# Patient Record
Sex: Female | Born: 2000 | Race: White | Hispanic: No | Marital: Single | Smoking: Never smoker
Health system: Southern US, Community
[De-identification: ages and names within clinical notes are randomized; demographics above are authoritative.]

## PROBLEM LIST (undated history)

## (undated) DIAGNOSIS — G43909 Migraine, unspecified, not intractable, without status migrainosus: Secondary | ICD-10-CM

## (undated) HISTORY — DX: Migraine, unspecified, not intractable, without status migrainosus: G43.909

---
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
## 2018-01-05 MED FILL — VYVANSE 30 MG CAPSULE: 30 | 30 days supply | Qty: 30 | Fill #0

## 2018-01-20 ENCOUNTER — Other Ambulatory Visit: Payer: Self-pay | Admitting: Pediatrics

## 2018-01-25 ENCOUNTER — Ambulatory Visit: Payer: 59 | Admitting: Neurology

## 2018-03-01 ENCOUNTER — Encounter: Payer: Self-pay | Admitting: Neurology

## 2018-03-08 ENCOUNTER — Encounter: Payer: Self-pay | Admitting: Neurology

## 2018-03-18 DIAGNOSIS — Z7182 Exercise counseling: Secondary | ICD-10-CM | POA: Diagnosis not present

## 2018-03-18 DIAGNOSIS — F901 Attention-deficit hyperactivity disorder, predominantly hyperactive type: Secondary | ICD-10-CM | POA: Diagnosis not present

## 2018-03-18 DIAGNOSIS — Z713 Dietary counseling and surveillance: Secondary | ICD-10-CM | POA: Diagnosis not present

## 2018-03-22 MED FILL — VYVANSE 30 MG CAPSULE: 30 | 30 days supply | Qty: 30 | Fill #0

## 2018-03-22 MED FILL — DEXTROAMP-AMPHETAMINE 5 MG: 5 | 30 days supply | Qty: 60 | Fill #0

## 2018-04-21 ENCOUNTER — Other Ambulatory Visit: Payer: Self-pay | Admitting: Family

## 2018-04-21 ENCOUNTER — Other Ambulatory Visit: Payer: Self-pay | Admitting: Pediatrics

## 2018-04-21 DIAGNOSIS — N926 Irregular menstruation, unspecified: Secondary | ICD-10-CM

## 2018-04-21 MED ORDER — AMPHETAMINE-DEXTROAMPHETAMINE 5 MG PO TABS: 5.0000 mg | ORAL_TABLET | Freq: Every day | ORAL | 0 refills | Status: AC

## 2018-04-21 MED ORDER — VYVANSE 30 MG PO CAPS: 30.0000 mg | ORAL_CAPSULE | Freq: Every day | ORAL | 0 refills | Status: AC

## 2018-04-21 MED FILL — AMPHETAMINE-DEXTROAMPHETAMI: 5 | 30 days supply | Qty: 30 | Fill #0

## 2018-04-21 MED FILL — LARIN FE 1.5-30 TABLET: 1.5-30 | 84 days supply | Qty: 112 | Fill #0

## 2018-04-21 MED FILL — VYVANSE 30 MG CAPSULE: 30 | 30 days supply | Qty: 30 | Fill #0

## 2018-05-19 MED FILL — VYVANSE 30 MG CAPSULE: 30 | 30 days supply | Qty: 30 | Fill #0

## 2018-05-19 MED FILL — DEXTROAMP-AMPHETAMINE 5 MG: 5 | 30 days supply | Qty: 30 | Fill #0

## 2018-07-01 ENCOUNTER — Other Ambulatory Visit: Payer: Self-pay | Admitting: Pediatrics

## 2018-07-01 DIAGNOSIS — B9689 Other specified bacterial agents as the cause of diseases classified elsewhere: Secondary | ICD-10-CM

## 2018-07-01 DIAGNOSIS — N760 Acute vaginitis: Principal | ICD-10-CM

## 2018-07-01 MED FILL — VYVANSE 30 MG CAPSULE: 30 | 30 days supply | Qty: 30 | Fill #0

## 2018-07-01 MED FILL — metroNIDAZOLE 0.75 % GEL: 0.75 | 90 days supply | Qty: 70 | Fill #0

## 2018-07-01 MED FILL — DEXTROAMP-AMPHETAMINE 5 MG: 5 | 30 days supply | Qty: 30 | Fill #0

## 2018-07-26 DIAGNOSIS — Z7182 Exercise counseling: Secondary | ICD-10-CM | POA: Diagnosis not present

## 2018-07-26 DIAGNOSIS — F901 Attention-deficit hyperactivity disorder, predominantly hyperactive type: Secondary | ICD-10-CM | POA: Diagnosis not present

## 2018-07-26 DIAGNOSIS — Z713 Dietary counseling and surveillance: Secondary | ICD-10-CM | POA: Diagnosis not present

## 2018-07-27 DIAGNOSIS — Z0120 Encounter for dental examination and cleaning without abnormal findings: Secondary | ICD-10-CM | POA: Diagnosis not present

## 2018-07-28 MED FILL — CYCLOBENZAPRINE HCL 5 MG TA: 5 | 23 days supply | Qty: 10 | Fill #2

## 2018-07-29 ENCOUNTER — Other Ambulatory Visit: Payer: Self-pay | Admitting: Neurology

## 2018-07-29 DIAGNOSIS — G43409 Hemiplegic migraine, not intractable, without status migrainosus: Secondary | ICD-10-CM

## 2018-07-29 MED FILL — LARIN FE 1.5-30 TABLET: 1.5-30 | 84 days supply | Qty: 112 | Fill #1

## 2018-08-01 ENCOUNTER — Telehealth: Payer: Self-pay | Admitting: Neurology

## 2018-08-01 MED FILL — PREVIDENT 5000 BOOSTER PLUS: 1.1 | 30 days supply | Qty: 100 | Fill #0

## 2018-08-01 MED FILL — VYVANSE 30 MG CAPSULE: 30 | 30 days supply | Qty: 30 | Fill #0

## 2018-08-01 MED FILL — DEXTROAMP-AMPHETAMINE 5 MG: 5 | 30 days supply | Qty: 30 | Fill #0

## 2018-08-01 NOTE — Telephone Encounter (Signed)
Rx request was denied due to pt not being seen since 07/27/18 with no return appointment scheduled.  Will send new Rx once appointment is made. (this information was forwarded to the pharmacy last week when request was originally sent)

## 2018-08-01 NOTE — Telephone Encounter (Signed)
Patient's mother called and Maureen RalphsVivian is needing a refill on her Flexeril (10). She uses [facility]. She said a refill request has been faxed over. Thanks

## 2018-09-23 MED FILL — VYVANSE 30 MG CAPSULE: 30 | 30 days supply | Qty: 30 | Fill #0

## 2018-11-30 IMAGING — MR MR HEAD WO/W CM
9 of 10 series · 37 of 48 positions shown · IV contrast (9ML MULTIHANCE)
Comparison: None.

CLINICAL DATA: Chronic headaches, migraines for 2 years.

EXAM:
MRI HEAD WITHOUT AND WITH CONTRAST
TECHNIQUE: Multiplanar, multiecho pulse sequences of the brain and surrounding
structures were obtained without and with intravenous contrast.
CONTRAST:  9 cc MultiHance

[Series 2: T1 · sagittal · 5.0mm · 0.45mm/px · 2 of 21 slices shown]
[im 1/21]
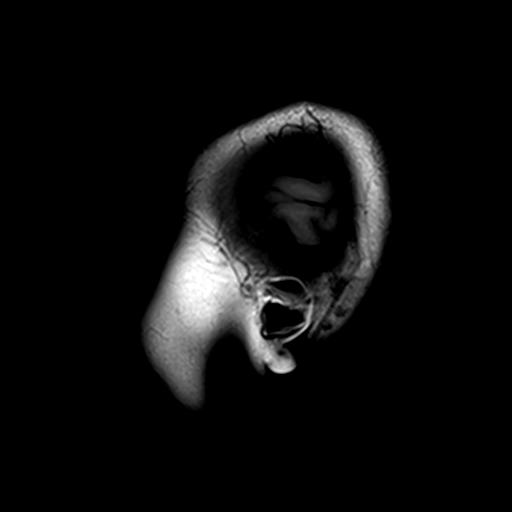
[im 21/21]
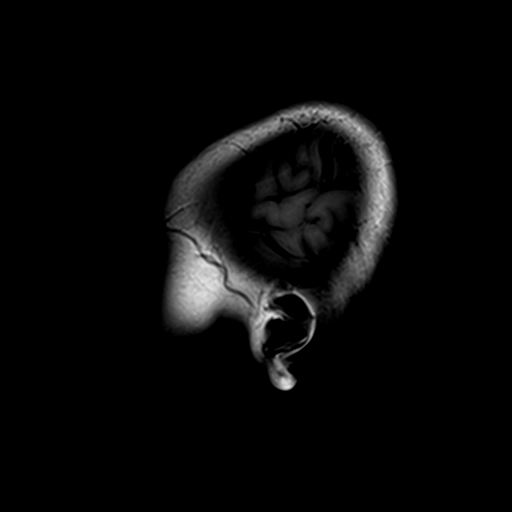

[Series 3: DWI · axial · 3.0mm · 1.80mm/px · z∈[-30,+117]mm · 7 of 100 slices shown (1 of 2)]
[im 1/100]
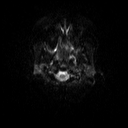
[im 17/100]
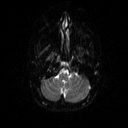
[im 34/100]
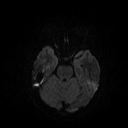
[im 50/100]
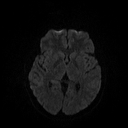
[im 67/100]
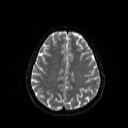
[im 83/100]
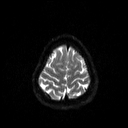
[im 100/100]
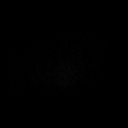

[Series 4: DWI · axial · 3.0mm · 1.80mm/px · z∈[-30,+117]mm · 3 of 48 slices shown (2 of 2)]
[im 1/48]
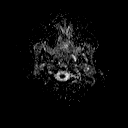
[im 24/48]
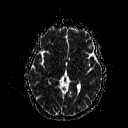
[im 48/48]
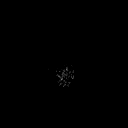

[Series 6: swi_images · axial · 2.0mm · 0.90mm/px · z∈[-44,+113]mm · 6 of 80 slices shown]
[im 1/80]
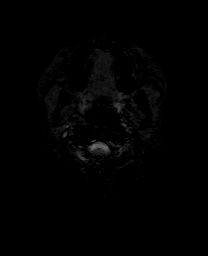
[im 16/80]
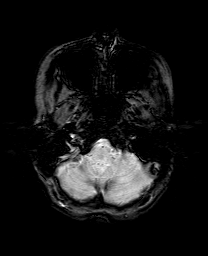
[im 32/80]
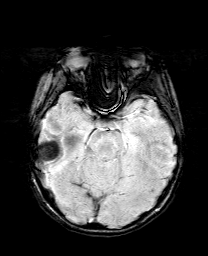
[im 48/80]
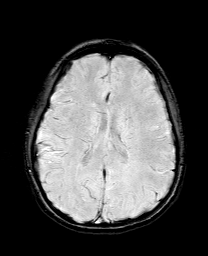
[im 64/80]
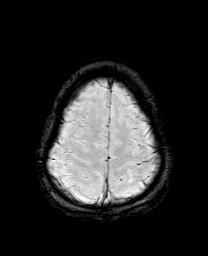
[im 80/80]
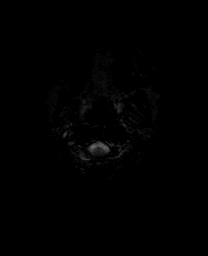

[Series 7: T2 · axial · 5.0mm · 0.51mm/px · z∈[-36,+105]mm · 2 of 22 slices shown (1 of 2)]
[im 1/22]
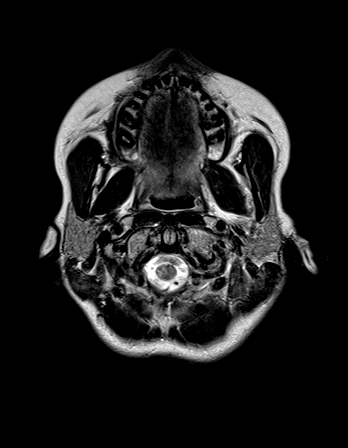
[im 22/22]
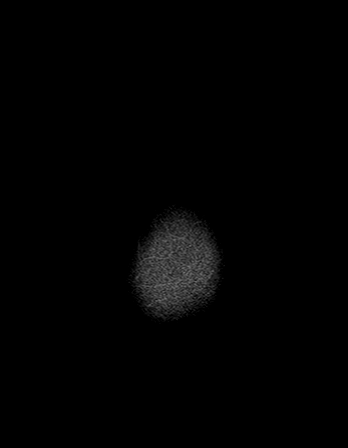

[Series 8: FLAIR · axial · 3.0mm · 0.45mm/px · z∈[-44,+112]mm · 2 of 27 slices shown]
[im 1/27]
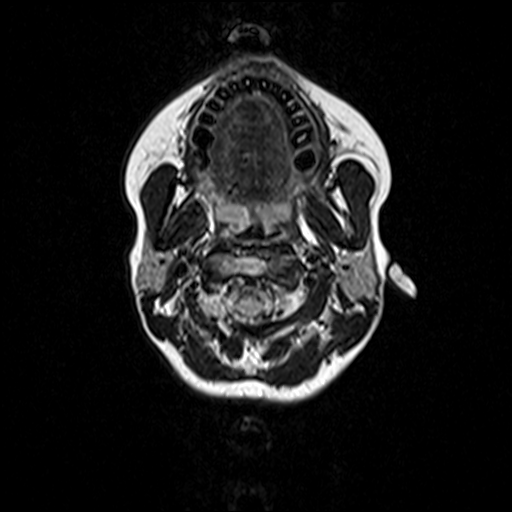
[im 27/27]
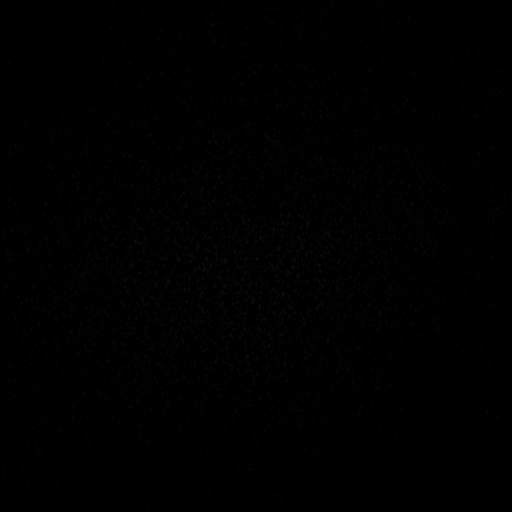

[Series 9: t1_mpr_tra · axial · 1.0mm · 0.45mm/px · z∈[-45,+114]mm · 8 of 160 slices shown (1 of 2)]
[im 1/160]
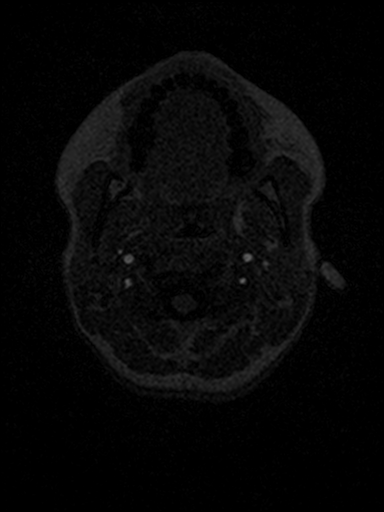
[im 32/160]
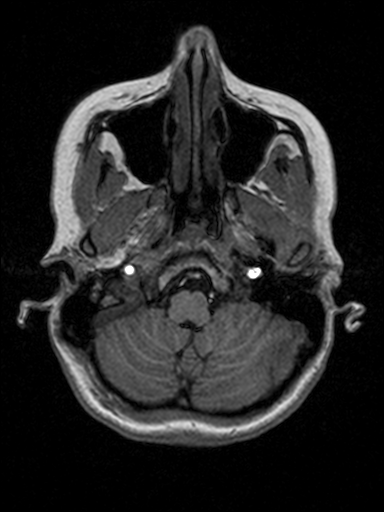
[im 48/160]
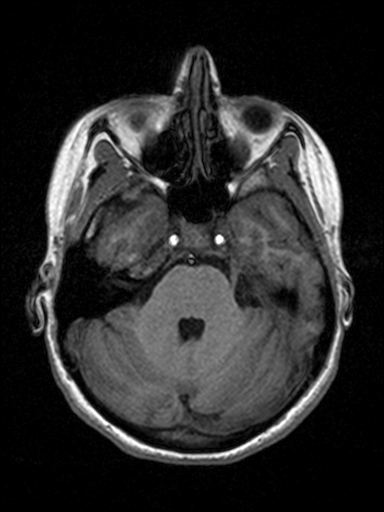
[im 64/160]
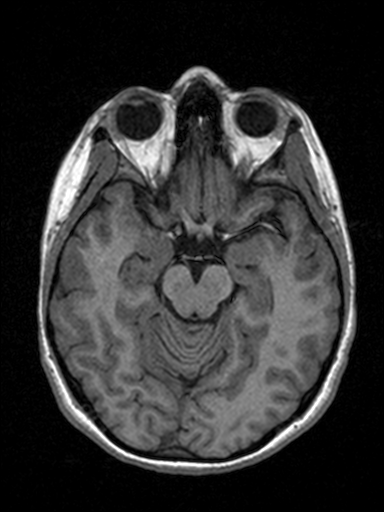
[im 96/160]
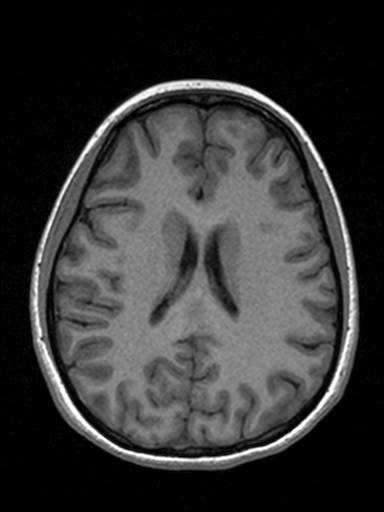
[im 112/160]
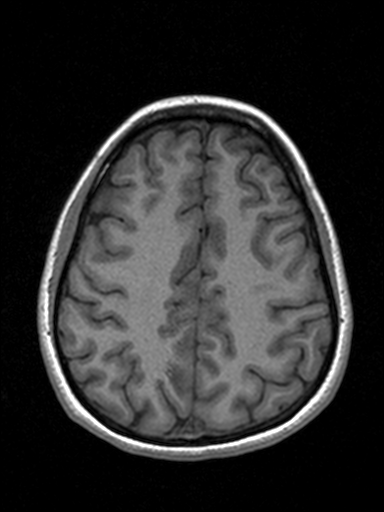
[im 128/160]
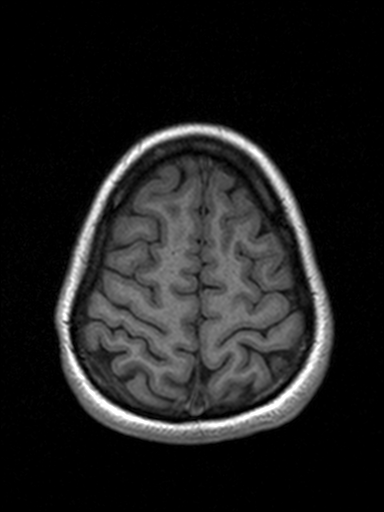
[im 160/160]
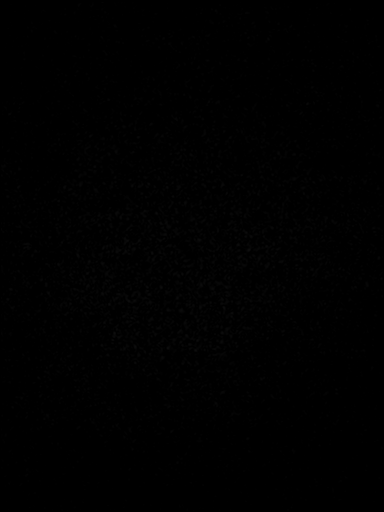

[Series 10: T2 · coronal · 5.0mm · 0.45mm/px · 2 of 26 slices shown (2 of 2)]
[im 1/26]
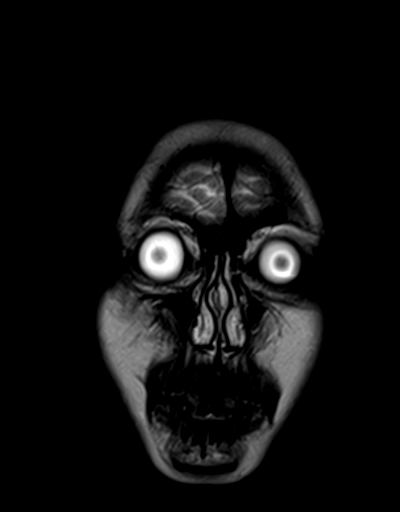
[im 26/26]
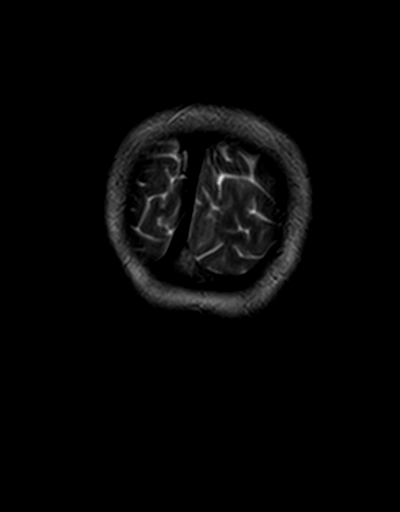

[Series 11: t1_mpr_tra · axial · 1.0mm · 0.45mm/px · z∈[-45,+50]mm · 5 of 160 slices shown (2 of 2)]
[im 1/160]
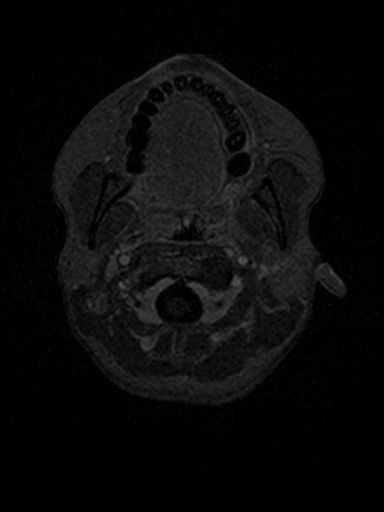
[im 32/160]
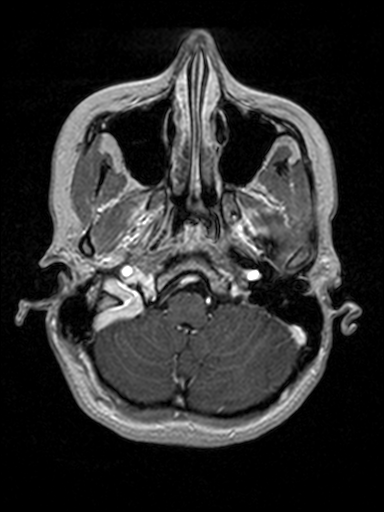
[im 48/160]
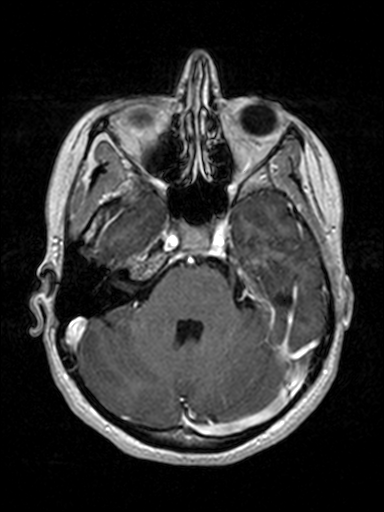
[im 64/160]
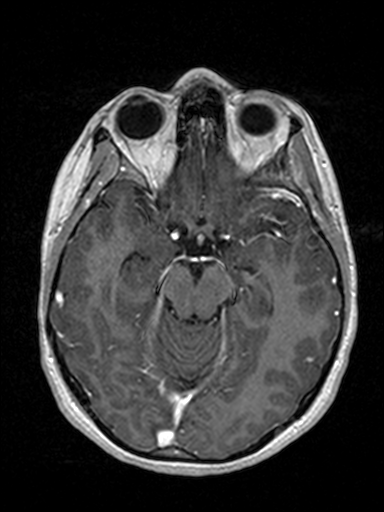
[im 96/160]
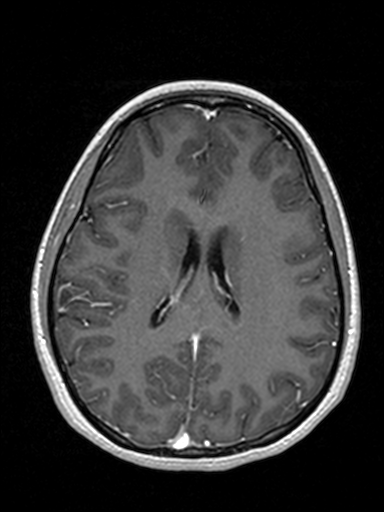

[37 of 48 positions shown; findings below may reference images not displayed]

FINDINGS: INTRACRANIAL CONTENTS: No reduced diffusion to suggest acute
ischemia or hyperacute demyelination. No susceptibility artifact to
suggest hemorrhage. The ventricles and sulci are normal for
patient's age. No suspicious parenchymal signal, masses, mass
effect. No abnormal intraparenchymal or extra-axial enhancement. No
abnormal extra-axial fluid collections. No extra-axial masses.

VASCULAR: Normal major intracranial vascular flow voids present at
skull base.

SKULL AND UPPER CERVICAL SPINE: No abnormal sellar expansion. No
suspicious calvarial bone marrow signal. Craniocervical junction
maintained.

SINUSES/ORBITS: The mastoid air-cells and included paranasal sinuses
are well-aerated.The included ocular globes and orbital contents are
non-suspicious.

OTHER: None.
IMPRESSION: Normal MRI of the head with and without contrast.

## 2018-12-06 ENCOUNTER — Encounter: Payer: Self-pay | Admitting: Neurology

## 2018-12-07 MED FILL — LARIN FE 1.5-30 TABLET: 1.5-30 | 84 days supply | Qty: 112 | Fill #2

## 2018-12-20 ENCOUNTER — Ambulatory Visit: Payer: 59 | Admitting: Neurology

## 2018-12-22 ENCOUNTER — Other Ambulatory Visit: Payer: Self-pay

## 2018-12-22 ENCOUNTER — Telehealth (INDEPENDENT_AMBULATORY_CARE_PROVIDER_SITE_OTHER): Payer: 59 | Admitting: Neurology

## 2018-12-22 DIAGNOSIS — Z713 Dietary counseling and surveillance: Secondary | ICD-10-CM | POA: Diagnosis not present

## 2018-12-22 DIAGNOSIS — F901 Attention-deficit hyperactivity disorder, predominantly hyperactive type: Secondary | ICD-10-CM | POA: Diagnosis not present

## 2018-12-22 DIAGNOSIS — Z0000 Encounter for general adult medical examination without abnormal findings: Secondary | ICD-10-CM | POA: Diagnosis not present

## 2018-12-22 DIAGNOSIS — Z7189 Other specified counseling: Secondary | ICD-10-CM | POA: Diagnosis not present

## 2018-12-22 DIAGNOSIS — Z68.41 Body mass index (BMI) pediatric, 5th percentile to less than 85th percentile for age: Secondary | ICD-10-CM | POA: Diagnosis not present

## 2018-12-22 DIAGNOSIS — G43409 Hemiplegic migraine, not intractable, without status migrainosus: Secondary | ICD-10-CM | POA: Diagnosis not present

## 2018-12-22 MED ORDER — CYCLOBENZAPRINE HCL 5 MG PO TABS: ORAL_TABLET | ORAL | 11 refills | Status: DC

## 2018-12-22 MED FILL — CYCLOBENZAPRINE HCL 5 MG TA: 5 | 28 days supply | Qty: 12 | Fill #0

## 2018-12-22 NOTE — Progress Notes (Signed)
Virtual Visit via Video Note The purpose of this virtual visit is to provide medical care while limiting exposure to the novel coronavirus.    Consent was obtained for video visit:  Yes.   Answered questions that patient had about telehealth interaction:  Yes.   I discussed the limitations, risks, security and privacy concerns of performing an evaluation and management service by telemedicine. I also discussed with the patient that there may be a patient responsible charge related to this service. The patient expressed understanding and agreed to proceed.  Pt location: Home Physician Location: office Name of referring provider:  Maryellen Pile, MD I connected with Willaim Bane at patients initiation/request on 12/22/2018 at 11:30 AM EDT by video enabled telemedicine application and verified that I am speaking with the correct person using two identifiers. Pt MRN:  [id number] Pt DOB:  03-18-2001 Video Participants:  Willaim Bane   History of Present Illness:  The patient was last seen in December 2018 for migraines. She had 2 episodes of complicated migraine in 2017 with left-sided numbness. She denies any further similar focal symptoms, but continues to have migraines around 5 times a month. Migraines usually start in her neck and radiate up her head, with associated sensitivity to lights and sound. No nausea/vomiting. She has prn Flexeril which has really helped her. She reports migraines come in phases, sometimes she gets 2 in a week, then none for a week. She takes Advil as well, usually around twice a week on average. She had contacted our office in 02/2018 to report an improvement in migraines with daily Zyrtec, she continues on Zyrtec. Sleep is good. She denies any dizziness, diplopia. Sometimes she has numbness in her hands and feet. No falls.   HPI 12/08/2016: This is a pleasant 18 yo RH woman with no significant past medical history with migraines with focal symptoms. She started having  headaches 2 years ago, probably around puberty. Headaches are usually behind the left eye, sometimes she would have 3 in a week, then do fine for a couple of weeks. In December/January, she had a different type of symptoms with the headache, she felt that one side of her body was numb. She thinks it was the right side, notes from PCP indicate left-sided numbness. She has had 2 of these episodes, last one was a couple of months ago. She has associated nausea, photophobia, no visual obscurations. One time her vision went black for a minute like her blood pressure was low. She takes 2 Advil which helps somewhat. She has a prescription for Maxalt but has not taken it. Headaches would last for a couple of hours, she would usually go to school late if she woke up with a headache. She has her menstrual period every month with no effect on the headaches. She is on Loestrin, and they wonder about effects of birth control on the migraines. She denies any dizziness, diplopia, dysarthria/dysphagia, neck pain, bowel/bladder dysfunction. She has some back pain with pain going down her back and neck. She has noticed more headaches when the weather is hot. Sleep is not that good, but she has not noticed any association of headaches with her sleep patterns. There is a strong family history of migraines in her mother, sister, maternal grandmother, aunt, and uncle. There was concern about prolactin levels in 2013, she had an MRI brain done at that time reported as unremarkable.     Observations/Objective:   GEN:  The patient appears stated age and  is in NAD. Neurological examination: Patient is awake, alert, oriented x 3. No aphasia or dysarthria. Intact fluency and comprehension. Remote and recent memory intact. Able to name and repeat. Cranial nerves: Extraocular movements intact with no nystagmus. No facial asymmetry. Motor: moves all extremities symmetrically, at least anti-gravity x 4. No incoordination on finger to nose  testing. Gait: narrow-based and steady, able to tandem walk adequately. Negative Romberg test.   Assessment and Plan:   This is a pleasant 18 yo RH woman with no significant past medical history, strong family history of migraines, with headaches are suggestive of migraine without aura, she has had 2 episodes since December/January where headaches would be followed by numbness/?weakness on one side of her body, suggestive of complicated/hemiplegic migraines. No further focal symptoms since then, she reports around 5 migraines a month with good response to Flexeril and Advil. Refills for Flexeril sent. We had previously discussed a daily migraine preventative medication, she is currently comfortable with prn rescue medications and will keep a migraine calendar. If headaches increase, we will consider propranolol or nortriptyline. She was advised to minimize rescue medication to 2-3 a week to avoid rebound headaches. She will follow-up in 6 months and knows to call for any changes.   Follow Up Instructions:   -I discussed the assessment and treatment plan with the patient. The patient was provided an opportunity to ask questions and all were answered. The patient agreed with the plan and demonstrated an understanding of the instructions.   The patient was advised to call back or seek an in-person evaluation if the symptoms worsen or if the condition fails to improve as anticipated.   Van ClinesKaren M Jasmain Ahlberg, MD

## 2018-12-23 MED FILL — AMPHETAMINE-DEXTROAMPHETAMI: 5 | 30 days supply | Qty: 30 | Fill #0

## 2018-12-23 MED FILL — VYVANSE 30 MG CAPSULE: 30 | 30 days supply | Qty: 30 | Fill #0

## 2018-12-26 ENCOUNTER — Ambulatory Visit: Payer: Self-pay | Admitting: Pediatrics

## 2018-12-28 ENCOUNTER — Ambulatory Visit (INDEPENDENT_AMBULATORY_CARE_PROVIDER_SITE_OTHER): Payer: 59 | Admitting: Pediatrics

## 2018-12-28 ENCOUNTER — Other Ambulatory Visit: Payer: Self-pay

## 2018-12-28 DIAGNOSIS — N926 Irregular menstruation, unspecified: Secondary | ICD-10-CM

## 2018-12-28 MED ORDER — NORETHIN ACE-ETH ESTRAD-FE 1.5-30 MG-MCG PO TABS: ORAL_TABLET | ORAL | 3 refills | Status: AC

## 2018-12-28 NOTE — Progress Notes (Signed)
Virtual Visit via Video Note  I connected with Caitlyn Wolf 's patient  on 12/28/18 at  2:00 PM EDT by a video enabled telemedicine application and verified that I am speaking with the correct person using two identifiers.   Location of patient/parent: at home    I discussed the limitations of evaluation and management by telemedicine and the availability of in person appointments.  I discussed that the purpose of this phone visit is to provide medical care while limiting exposure to the novel coronavirus.  The patient expressed understanding and agreed to proceed.  Reason for visit: f/u birth control and BV  History of Present Illness:  Finished school online last week. She did well.  Back for check for birth control pills today. Going well. She is not having any issues so she would like to stay on it.  BV completely went away- didn't finish treatments with metrogel for 6 months. Stopped after 2 months and it went away.  Not sexually active right now. Last time about 1 year ago. Same partner.   Seeing neuro and got represcribed flexeril.   Still taking ADHD medication. Going well with no concerns.    Observations/Objective:  Physical Exam Constitutional:      Appearance: Normal appearance.  Pulmonary:     Effort: Pulmonary effort is normal.  Musculoskeletal: Normal range of motion.  Neurological:     Mental Status: She is alert and oriented to person, place, and time.  Psychiatric:        Mood and Affect: Mood normal.        Behavior: Behavior normal.     Assessment and Plan:  1. Irregular menses Continue ocp. Repeat gc/ct when she is in the office next time. No new partners since last visit. Very pleased with ocp overall. Continuous cycles for convenience on and off.  - norethindrone-ethinyl estradiol-iron (LARIN FE 1.5/30) 1.5-30 MG-MCG tablet; TAKE 1 TABLET BY MOUTH ONCE DAILY (TAKE CONTINUOUSLY)  Dispense: 112 tablet; Refill: 3   Follow Up Instructions: 1 yeat or sooner  as needed    I discussed the assessment and treatment plan with the patient and/or parent/guardian. They were provided an opportunity to ask questions and all were answered. They agreed with the plan and demonstrated an understanding of the instructions.   They were advised to call back or seek an in-person evaluation in the emergency room if the symptoms worsen or if the condition fails to improve as anticipated.  I provided 10 minutes of non-face-to-face time and 0 minutes of care coordination during this encounter I was located at off site during this encounter.  Alfonso Ramus, FNP

## 2019-01-12 DIAGNOSIS — Z0120 Encounter for dental examination and cleaning without abnormal findings: Secondary | ICD-10-CM | POA: Diagnosis not present

## 2019-02-22 DIAGNOSIS — F902 Attention-deficit hyperactivity disorder, combined type: Secondary | ICD-10-CM | POA: Diagnosis not present

## 2019-02-27 MED FILL — VYVANSE 30 MG CAPSULE: 30 | 30 days supply | Qty: 30 | Fill #0

## 2019-02-27 MED FILL — LARIN FE 1.5-30 TABLET: 1.5-30 | 84 days supply | Qty: 112 | Fill #0

## 2019-02-27 MED FILL — DEXTROAMP-AMPHETAMINE 5 MG: 5 | 30 days supply | Qty: 30 | Fill #0

## 2019-03-01 DIAGNOSIS — F902 Attention-deficit hyperactivity disorder, combined type: Secondary | ICD-10-CM | POA: Diagnosis not present

## 2019-03-14 DIAGNOSIS — F902 Attention-deficit hyperactivity disorder, combined type: Secondary | ICD-10-CM | POA: Diagnosis not present

## 2019-03-18 DIAGNOSIS — Z713 Dietary counseling and surveillance: Secondary | ICD-10-CM | POA: Diagnosis not present

## 2019-03-18 DIAGNOSIS — Z7189 Other specified counseling: Secondary | ICD-10-CM | POA: Diagnosis not present

## 2019-03-18 DIAGNOSIS — F901 Attention-deficit hyperactivity disorder, predominantly hyperactive type: Secondary | ICD-10-CM | POA: Diagnosis not present

## 2019-04-29 DIAGNOSIS — Z23 Encounter for immunization: Secondary | ICD-10-CM | POA: Diagnosis not present

## 2019-06-17 DIAGNOSIS — Z713 Dietary counseling and surveillance: Secondary | ICD-10-CM | POA: Diagnosis not present

## 2019-06-17 DIAGNOSIS — F901 Attention-deficit hyperactivity disorder, predominantly hyperactive type: Secondary | ICD-10-CM | POA: Diagnosis not present

## 2019-06-17 DIAGNOSIS — Z7189 Other specified counseling: Secondary | ICD-10-CM | POA: Diagnosis not present

## 2019-06-29 MED FILL — LARIN FE 1.5-30 TABLET: 1.5-30 | 84 days supply | Qty: 112 | Fill #0

## 2019-08-07 ENCOUNTER — Other Ambulatory Visit: Payer: Self-pay

## 2019-08-07 ENCOUNTER — Encounter: Payer: Self-pay | Admitting: Neurology

## 2019-08-07 ENCOUNTER — Telehealth (INDEPENDENT_AMBULATORY_CARE_PROVIDER_SITE_OTHER): Payer: 59 | Admitting: Neurology

## 2019-08-07 VITALS — Ht 64.0 in | Wt 105.0 lb

## 2019-08-07 DIAGNOSIS — G43409 Hemiplegic migraine, not intractable, without status migrainosus: Secondary | ICD-10-CM

## 2019-08-07 MED ORDER — NORTRIPTYLINE HCL 10 MG PO CAPS: 10.0000 mg | ORAL_CAPSULE | Freq: Every day | ORAL | 5 refills | Status: AC

## 2019-08-07 MED ORDER — CYCLOBENZAPRINE HCL 5 MG PO TABS: ORAL_TABLET | ORAL | 11 refills | Status: AC

## 2019-08-07 MED FILL — NORTRIPTYLINE HCL 10 MG CAP: 10 | 30 days supply | Qty: 30 | Fill #0

## 2019-08-07 MED FILL — CYCLOBENZAPRINE HCL 5 MG TA: 5 | 28 days supply | Qty: 12 | Fill #0

## 2019-08-07 NOTE — Progress Notes (Signed)
Virtual Visit via Video Note The purpose of this virtual visit is to provide medical care while limiting exposure to the novel coronavirus.    Consent was obtained for video visit:  Yes.   Answered questions that patient had about telehealth interaction:  Yes.   I discussed the limitations, risks, security and privacy concerns of performing an evaluation and management service by telemedicine. I also discussed with the patient that there may be a patient responsible charge related to this service. The patient expressed understanding and agreed to proceed.  Pt location: Home Physician Location: office Name of referring provider:  Karleen Dolphin, MD I connected with Caitlyn Wolf at patients initiation/request on 08/07/2019 at  4:00 PM EST by video enabled telemedicine application and verified that I am speaking with the correct person using two identifiers. Pt MRN:  [id number] Pt DOB:  09/29/2000 Video Participants:  Caitlyn Wolf   History of Present Illness:  The patient was seen as a virtual video visit on 08/07/2019. She was last seen 7 months ago for migraines. She had 2 episodes of complicated migraine in 7106 with left-sided numbness, no further recurrence of focal symptoms. She reports an increase in migraine frequency to 4- 5 times a week on average. Pain usually starts on the left side of her neck or behind her left eye. She has prn Flexeril and Advil which help abort migraines. Sometimes she does not take medication and "sucks it up." Migraine would last for hours without medication. She denies any vision changes, dizziness, no falls. She usually gets 9-10 hours of sleep. Mood is good.    History on Initial Assessment 12/08/2016: This is a pleasant 18 yo RH woman with no significant past medical history with migraines with focal symptoms. She started having headaches 2 years ago, probably around puberty. Headaches are usually behind the left eye, sometimes she would have 3 in a week,  then do fine for a couple of weeks. In December/January, she had a different type of symptoms with the headache, she felt that one side of her body was numb. She thinks it was the right side, notes from PCP indicate left-sided numbness. She has had 2 of these episodes, last one was a couple of months ago. She has associated nausea, photophobia, no visual obscurations. One time her vision went black for a minute like her blood pressure was low. She takes 2 Advil which helps somewhat. She has a prescription for Maxalt but has not taken it. Headaches would last for a couple of hours, she would usually go to school late if she woke up with a headache. She has her menstrual period every month with no effect on the headaches. She is on Loestrin, and they wonder about effects of birth control on the migraines. She denies any dizziness, diplopia, dysarthria/dysphagia, neck pain, bowel/bladder dysfunction. She has some back pain with pain going down her back and neck. She has noticed more headaches when the weather is hot. Sleep is not that good, but she has not noticed any association of headaches with her sleep patterns. There is a strong family history of migraines in her mother, sister, maternal grandmother, aunt, and uncle. There was concern about prolactin levels in 2013, she had an MRI brain done at that time reported as unremarkable.     Observations/Objective:   Patient is awake, alert, oriented x 3. No aphasia or dysarthria. Intact fluency and comprehension. Remote and recent memory intact. Able to name and repeat. Cranial nerves:  Extraocular movements intact with no nystagmus. No facial asymmetry. Motor: moves all extremities symmetrically, at least anti-gravity x 4.   Assessment and Plan:   This is a pleasant 18 yo RH woman with no significant past medical history, strong family history of migraines, with headaches are suggestive of migraine without aura, in addition she has had 2 episodes in 2017 where  headaches would be followed by numbness/?weakness on one side of her body, suggestive of complicated/hemiplegic migraines. No further focal symptoms since 2017, she is reporting 4-5 migraines a week and is interested in starting a daily preventative medication. Options were discussed, she would like to try nortriptyline. Side effects discussed, start nortriptyline 10mg  qhs. She has prn Flexeril and Advil for rescue. She knows to minimize rescue medication to 2-3 a week to avoid rebound headaches. She was instructed to call our office for an update in 2 months, we may uptitrate dose as tolerated. Follow-up in 6 months, she knows to call for any changes.   Follow Up Instructions:   -I discussed the assessment and treatment plan with the patient. The patient was provided an opportunity to ask questions and all were answered. The patient agreed with the plan and demonstrated an understanding of the instructions.   The patient was advised to call back or seek an in-person evaluation if the symptoms worsen or if the condition fails to improve as anticipated.   , MD

## 2019-08-15 MED FILL — NORTRIPTYLINE HCL 10 MG CAP: 10 | 30 days supply | Qty: 30 | Fill #0

## 2019-08-17 ENCOUNTER — Other Ambulatory Visit (INDEPENDENT_AMBULATORY_CARE_PROVIDER_SITE_OTHER): Payer: 59

## 2019-08-17 ENCOUNTER — Other Ambulatory Visit: Payer: Self-pay | Admitting: Family

## 2019-08-17 ENCOUNTER — Other Ambulatory Visit: Payer: Self-pay

## 2019-08-17 ENCOUNTER — Other Ambulatory Visit (HOSPITAL_COMMUNITY)
Admission: RE | Admit: 2019-08-17 | Discharge: 2019-08-17 | Disposition: A | Discharge status: Home / Self Care | Payer: 59 | Source: Ambulatory Visit | Attending: Pediatrics | Admitting: Pediatrics

## 2019-08-17 DIAGNOSIS — R300 Dysuria: Secondary | ICD-10-CM | POA: Diagnosis not present

## 2019-08-17 DIAGNOSIS — N3001 Acute cystitis with hematuria: Secondary | ICD-10-CM

## 2019-08-17 DIAGNOSIS — Z113 Encounter for screening for infections with a predominantly sexual mode of transmission: Secondary | ICD-10-CM

## 2019-08-17 LAB — POCT URINALYSIS DIPSTICK
Bilirubin, UA: NEGATIVE
Blood, UA: POSITIVE
Glucose, UA: NEGATIVE
Nitrite, UA: POSITIVE
Protein, UA: POSITIVE — AB
Spec Grav, UA: 1.02 (ref 1.010–1.025)
Urobilinogen, UA: 0.2 E.U./dL
pH, UA: 5 (ref 5.0–8.0)

## 2019-08-17 MED ORDER — NITROFURANTOIN MONOHYD MACRO 100 MG PO CAPS: 100.0000 mg | ORAL_CAPSULE | Freq: Two times a day (BID) | ORAL | 0 refills | Status: AC

## 2019-08-17 MED ORDER — PHENAZOPYRIDINE HCL 200 MG PO TABS: 200.0000 mg | ORAL_TABLET | Freq: Three times a day (TID) | ORAL | 0 refills | Status: AC | PRN

## 2019-08-17 MED FILL — NITROFURANTOIN MONO-MCR 100: 100 | 7 days supply | Qty: 14 | Fill #0

## 2019-08-17 MED FILL — PHENAZOPYRIDINE 200 MG TAB: 200 | 3 days supply | Qty: 10 | Fill #0

## 2019-08-18 LAB — URINALYSIS
Bilirubin Urine: NEGATIVE
Glucose, UA: NEGATIVE
Nitrite: POSITIVE — AB
Specific Gravity, Urine: 1.024 (ref 1.001–1.03)
pH: 5.5 (ref 5.0–8.0)

## 2019-08-18 LAB — URINE CYTOLOGY ANCILLARY ONLY
Chlamydia: NEGATIVE
Comment: NEGATIVE
Comment: NORMAL
Neisseria Gonorrhea: NEGATIVE

## 2019-08-18 LAB — WET PREP BY MOLECULAR PROBE
Candida species: NOT DETECTED
Gardnerella vaginalis: NOT DETECTED
MICRO NUMBER:: 10018107
SPECIMEN QUALITY:: ADEQUATE
Trichomonas vaginosis: NOT DETECTED

## 2019-08-19 LAB — URINE CULTURE
MICRO NUMBER:: 10018103
SPECIMEN QUALITY:: ADEQUATE

## 2019-08-25 MED FILL — CYCLOBENZAPRINE HCL 5 MG TA: 5 | 28 days supply | Qty: 12 | Fill #1

## 2019-08-25 MED FILL — AMPHETAMINE-DEXTROAMPHETAMI: 10 | 30 days supply | Qty: 30 | Fill #0

## 2019-08-25 MED FILL — VYVANSE 30 MG CAPSULE: 30 | 30 days supply | Qty: 30 | Fill #0

## 2019-09-29 MED FILL — VYVANSE 30 MG CAPSULE: 30 | 30 days supply | Qty: 30 | Fill #0

## 2019-09-29 MED FILL — AMPHETAMINE-DEXTROAMPHETAMI: 10 | 30 days supply | Qty: 30 | Fill #0

## 2019-10-12 DIAGNOSIS — Z3009 Encounter for other general counseling and advice on contraception: Secondary | ICD-10-CM | POA: Diagnosis not present

## 2019-10-12 DIAGNOSIS — Z713 Dietary counseling and surveillance: Secondary | ICD-10-CM | POA: Diagnosis not present

## 2019-10-12 DIAGNOSIS — F901 Attention-deficit hyperactivity disorder, predominantly hyperactive type: Secondary | ICD-10-CM | POA: Diagnosis not present

## 2019-10-12 DIAGNOSIS — Z7189 Other specified counseling: Secondary | ICD-10-CM | POA: Diagnosis not present

## 2019-11-20 DIAGNOSIS — Z30430 Encounter for insertion of intrauterine contraceptive device: Secondary | ICD-10-CM | POA: Diagnosis not present

## 2019-11-20 MED FILL — AMPHETAMINE-DEXTROAMPHETAMI: 10 | 30 days supply | Qty: 30 | Fill #0

## 2019-11-20 MED FILL — VYVANSE 30 MG CAPSULE: 30 | 30 days supply | Qty: 30 | Fill #0

## 2020-01-01 DIAGNOSIS — Z30431 Encounter for routine checking of intrauterine contraceptive device: Secondary | ICD-10-CM | POA: Diagnosis not present

## 2020-01-02 DIAGNOSIS — Z68.41 Body mass index (BMI) pediatric, 5th percentile to less than 85th percentile for age: Secondary | ICD-10-CM | POA: Diagnosis not present

## 2020-01-02 DIAGNOSIS — Z0000 Encounter for general adult medical examination without abnormal findings: Secondary | ICD-10-CM | POA: Diagnosis not present

## 2020-01-02 DIAGNOSIS — Z713 Dietary counseling and surveillance: Secondary | ICD-10-CM | POA: Diagnosis not present

## 2020-01-02 DIAGNOSIS — Z7189 Other specified counseling: Secondary | ICD-10-CM | POA: Diagnosis not present

## 2020-01-02 DIAGNOSIS — F901 Attention-deficit hyperactivity disorder, predominantly hyperactive type: Secondary | ICD-10-CM | POA: Diagnosis not present

## 2020-01-02 MED FILL — VYVANSE 30 MG CAPSULE: 30 | 30 days supply | Qty: 30 | Fill #0

## 2020-01-02 MED FILL — AMPHETAMINE-DEXTROAMPHETAMI: 10 | 30 days supply | Qty: 30 | Fill #0

## 2020-01-18 DIAGNOSIS — R21 Rash and other nonspecific skin eruption: Secondary | ICD-10-CM | POA: Diagnosis not present

## 2020-02-08 DIAGNOSIS — J029 Acute pharyngitis, unspecified: Secondary | ICD-10-CM | POA: Diagnosis not present

## 2020-02-08 MED FILL — VYVANSE 30 MG CAPSULE: 30 | 30 days supply | Qty: 30 | Fill #0

## 2020-02-08 MED FILL — AMPHETAMINE SALTS 10 MG: 10 | 30 days supply | Qty: 30 | Fill #0

## 2020-02-08 MED FILL — AMOX-CLAV 875-125 MG TABLET: 875-125 | 10 days supply | Qty: 20 | Fill #0

## 2020-02-09 MED FILL — predniSONE 20 MG TABS: 20 | 4 days supply | Qty: 8 | Fill #0

## 2020-02-09 MED FILL — ALBUTEROL SULFATE HFA 108 (: 108 (90 BAS | 16 days supply | Qty: 18 | Fill #0

## 2020-03-04 ENCOUNTER — Ambulatory Visit: Payer: 59 | Admitting: Neurology

## 2020-03-12 DIAGNOSIS — Z713 Dietary counseling and surveillance: Secondary | ICD-10-CM | POA: Diagnosis not present

## 2020-03-12 DIAGNOSIS — F901 Attention-deficit hyperactivity disorder, predominantly hyperactive type: Secondary | ICD-10-CM | POA: Diagnosis not present

## 2020-03-12 DIAGNOSIS — Z7189 Other specified counseling: Secondary | ICD-10-CM | POA: Diagnosis not present

## 2020-03-12 MED FILL — AMPHETAMINE SALTS 10 MG: 10 | 30 days supply | Qty: 30 | Fill #0

## 2020-03-12 MED FILL — VYVANSE 30 MG CAPSULE: 30 | 30 days supply | Qty: 30 | Fill #0

## 2020-06-04 ENCOUNTER — Other Ambulatory Visit (HOSPITAL_COMMUNITY): Payer: Self-pay | Admitting: Obstetrics and Gynecology

## 2020-06-04 DIAGNOSIS — Z113 Encounter for screening for infections with a predominantly sexual mode of transmission: Secondary | ICD-10-CM | POA: Diagnosis not present

## 2020-06-04 DIAGNOSIS — R102 Pelvic and perineal pain: Secondary | ICD-10-CM | POA: Diagnosis not present

## 2020-06-04 DIAGNOSIS — N8500 Endometrial hyperplasia, unspecified: Secondary | ICD-10-CM | POA: Diagnosis not present

## 2020-06-04 MED FILL — AMOX-CLAV 875-125 MG TABLET: 875-125 | 7 days supply | Qty: 14 | Fill #0

## 2020-06-04 MED FILL — VYVANSE 30 MG CAPSULE: 30 | 30 days supply | Qty: 30 | Fill #0

## 2020-06-04 MED FILL — AMPHETAMINE SALTS 10 MG: 10 | 30 days supply | Qty: 30 | Fill #0

## 2020-07-02 DIAGNOSIS — Z23 Encounter for immunization: Secondary | ICD-10-CM | POA: Diagnosis not present

## 2020-07-02 DIAGNOSIS — L659 Nonscarring hair loss, unspecified: Secondary | ICD-10-CM | POA: Diagnosis not present

## 2020-07-02 DIAGNOSIS — Z7189 Other specified counseling: Secondary | ICD-10-CM | POA: Diagnosis not present

## 2020-07-02 DIAGNOSIS — Z713 Dietary counseling and surveillance: Secondary | ICD-10-CM | POA: Diagnosis not present

## 2020-07-02 DIAGNOSIS — R634 Abnormal weight loss: Secondary | ICD-10-CM | POA: Diagnosis not present

## 2020-07-02 DIAGNOSIS — F901 Attention-deficit hyperactivity disorder, predominantly hyperactive type: Secondary | ICD-10-CM | POA: Diagnosis not present

## 2020-08-12 DIAGNOSIS — Z1159 Encounter for screening for other viral diseases: Secondary | ICD-10-CM | POA: Diagnosis not present

## 2020-09-03 ENCOUNTER — Other Ambulatory Visit (HOSPITAL_COMMUNITY): Payer: Self-pay | Admitting: Pediatrics

## 2020-09-03 MED FILL — DEXMETHYLPHENIDATE HCL ER 2: 20 | 30 days supply | Qty: 30 | Fill #0
